# Patient Record
Sex: Male | Born: 1990 | Race: White | Hispanic: No | Marital: Single
Health system: Southern US, Community
[De-identification: ages and names within clinical notes are randomized; demographics above are authoritative.]

---

## 2010-06-07 ENCOUNTER — Emergency Department (HOSPITAL_COMMUNITY): Payer: BLUE CROSS/BLUE SHIELD

## 2010-06-07 ENCOUNTER — Emergency Department (HOSPITAL_COMMUNITY)
Admission: EM | Admit: 2010-06-07 | Discharge: 2010-06-07 | Disposition: A | Discharge status: Home / Self Care | Payer: BLUE CROSS/BLUE SHIELD | Attending: Emergency Medicine | Admitting: Emergency Medicine

## 2010-06-07 DIAGNOSIS — R110 Nausea: Secondary | ICD-10-CM | POA: Insufficient documentation

## 2010-06-07 DIAGNOSIS — R55 Syncope and collapse: Secondary | ICD-10-CM | POA: Insufficient documentation

## 2010-06-07 LAB — DIFFERENTIAL
Basophils Absolute: 0.1 10*3/uL (ref 0.0–0.1)
Basophils Relative: 1 % (ref 0–1)
Monocytes Absolute: 0.7 10*3/uL (ref 0.1–1.0)
Neutro Abs: 5.9 10*3/uL (ref 1.7–7.7)
Neutrophils Relative %: 67 % (ref 43–77)

## 2010-06-07 LAB — CBC
Hemoglobin: 14.9 g/dL (ref 13.0–17.0)
MCHC: 35.9 g/dL (ref 30.0–36.0)
WBC: 8.7 10*3/uL (ref 4.0–10.5)

## 2010-06-07 LAB — RAPID URINE DRUG SCREEN, HOSP PERFORMED
Barbiturates: NOT DETECTED
Opiates: NOT DETECTED

## 2010-06-07 LAB — POCT I-STAT, CHEM 8
BUN: 13 mg/dL (ref 6–23)
Hemoglobin: 15.3 g/dL (ref 13.0–17.0)
Sodium: 139 mEq/L (ref 135–145)
TCO2: 26 mmol/L (ref 0–100)

## 2010-06-07 LAB — URINALYSIS, ROUTINE W REFLEX MICROSCOPIC
Glucose, UA: NEGATIVE mg/dL
Protein, ur: NEGATIVE mg/dL
Urobilinogen, UA: 0.2 mg/dL (ref 0.0–1.0)

## 2013-03-14 IMAGING — CT CT HEAD W/O CM
2 series · 16 of 30 positions shown, 20 images · non-contrast
Comparison: None.

CLINICAL DATA: Syncope

CT HEAD WITHOUT CONTRAST
TECHNIQUE: Contiguous axial images were obtained from the base of
the skull through the vertex without contrast

[Series 2: head w/o · axial · non-contrast · 0.49mm/px · z∈[+122,+252]mm · 13 of 32 slices shown, 17 images]
[im 3/32  brain]
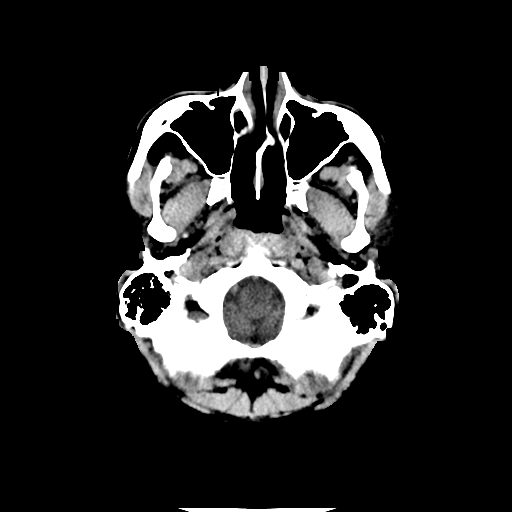
[im 3/32  bone]
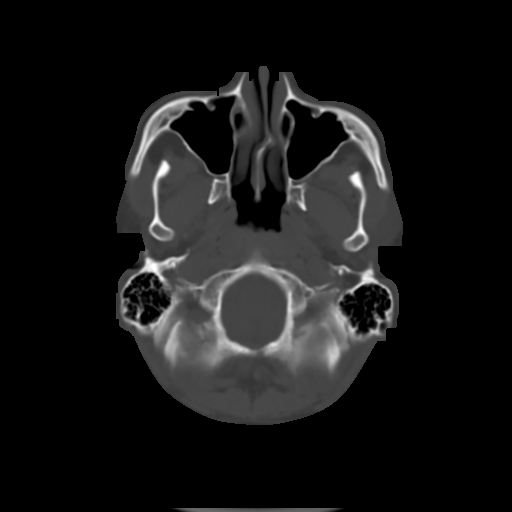
[im 5/32  brain]
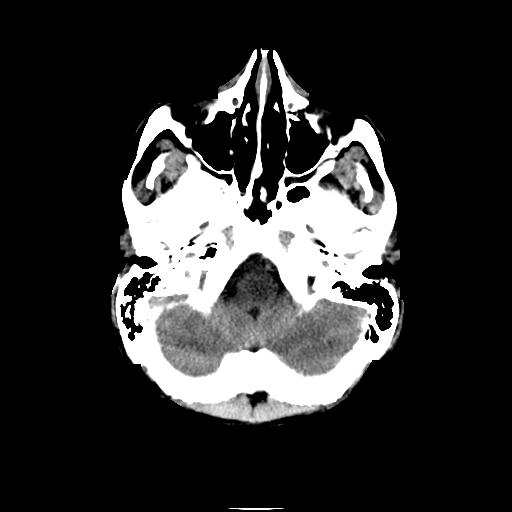
[im 7/32  brain]
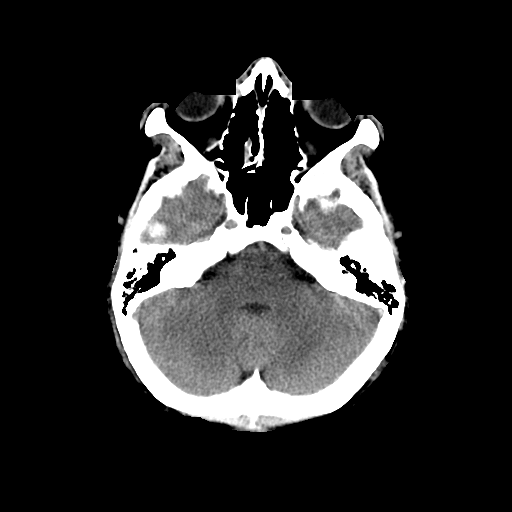
[im 9/32  brain]
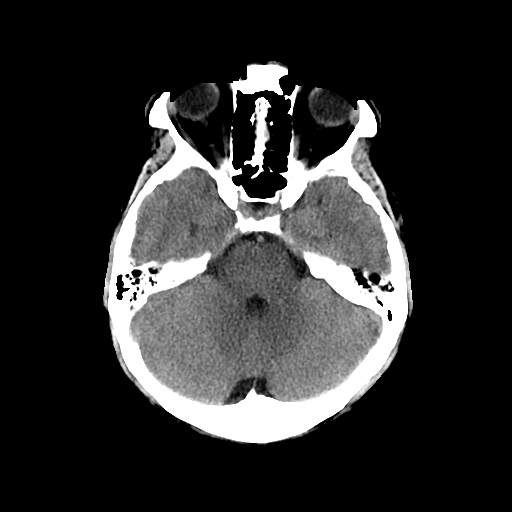
[im 12/32  brain]
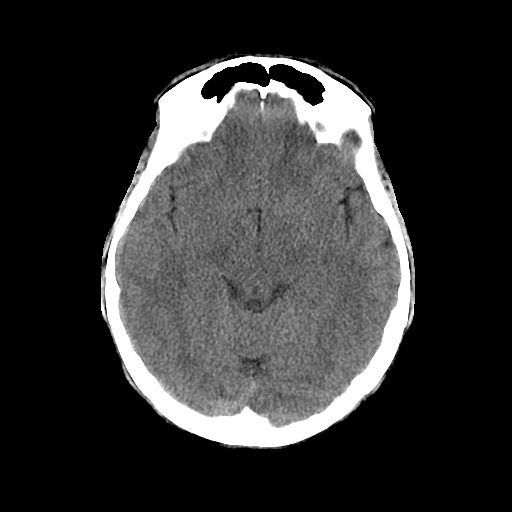
[im 12/32  bone]
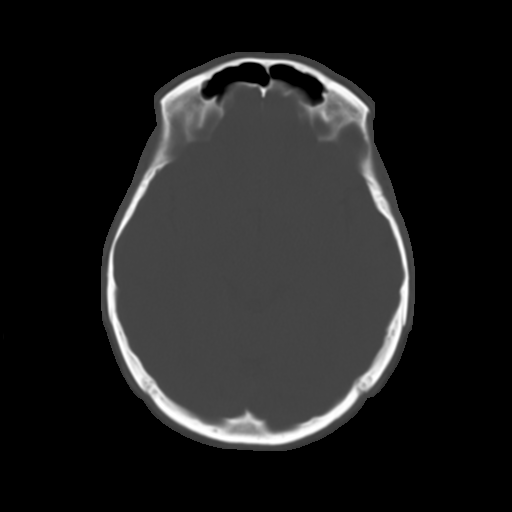
[im 14/32  brain]
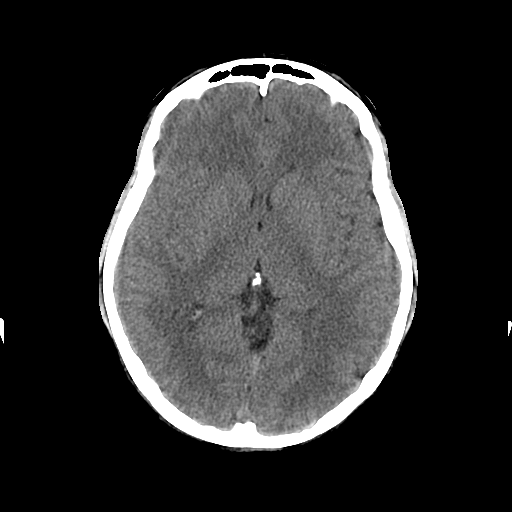
[im 16/32  brain]
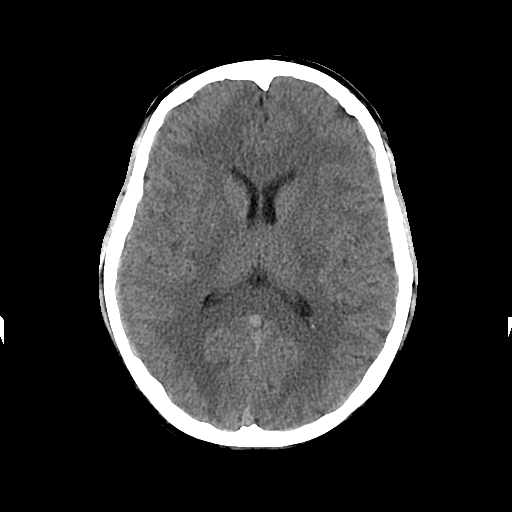
[im 18/32  brain]
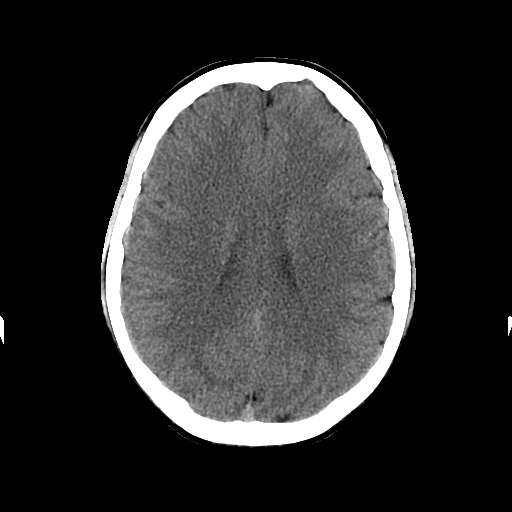
[im 20/32  brain]
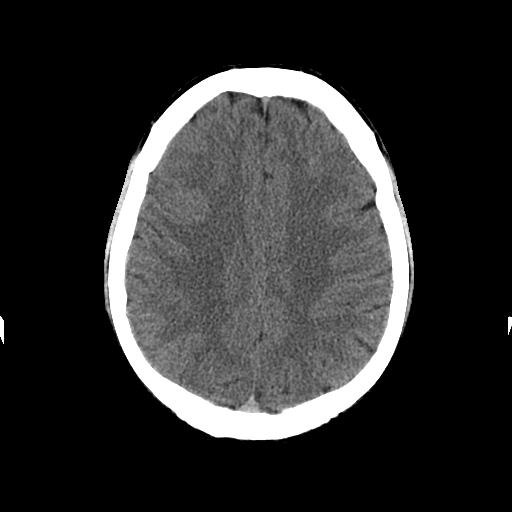
[im 20/32  bone]
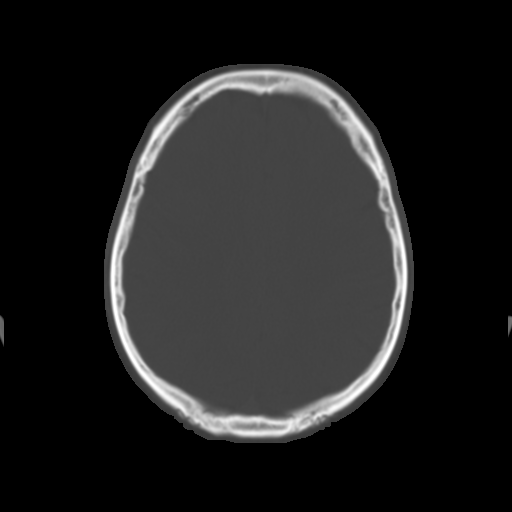
[im 23/32  brain]
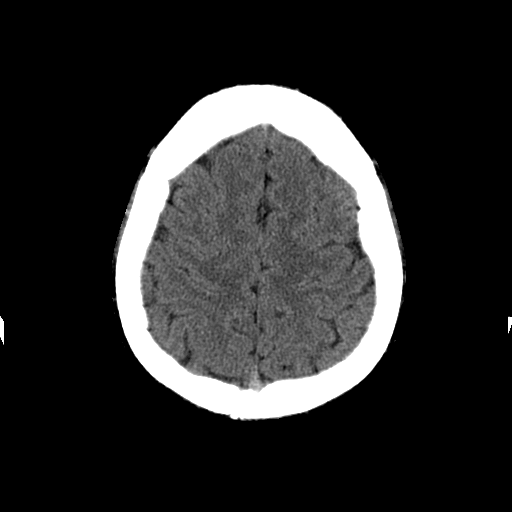
[im 25/32  brain]
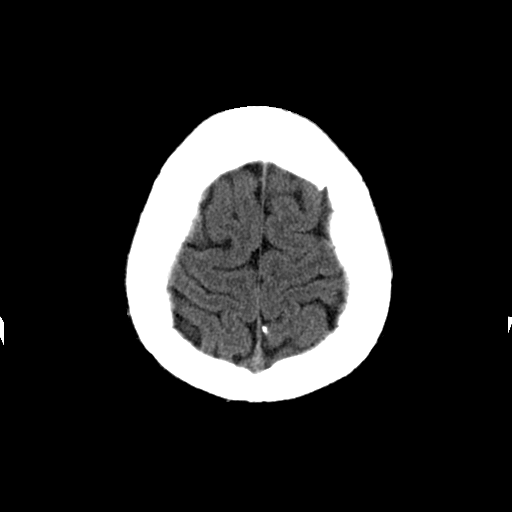
[im 27/32  brain]
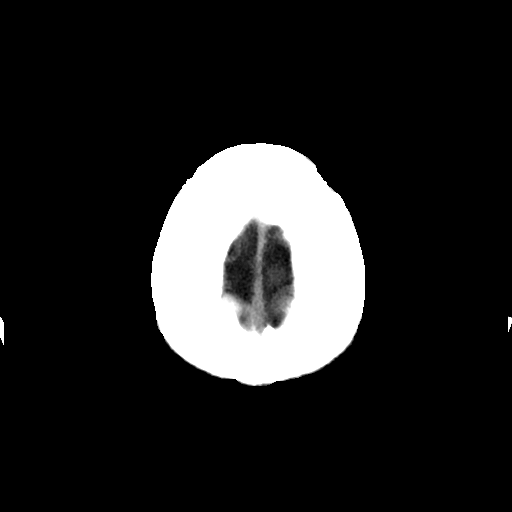
[im 29/32  brain]
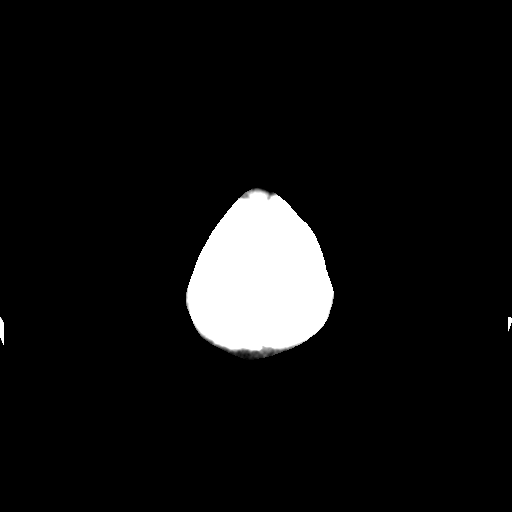
[im 29/32  bone]
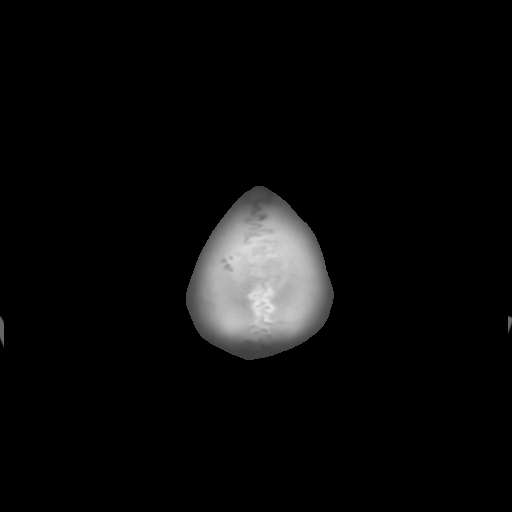

[Series 3: head w/o bone · axial · non-contrast · 0.49mm/px · z∈[+122,+167]mm · 3 of 32 slices shown]
[im 3/32  bone]
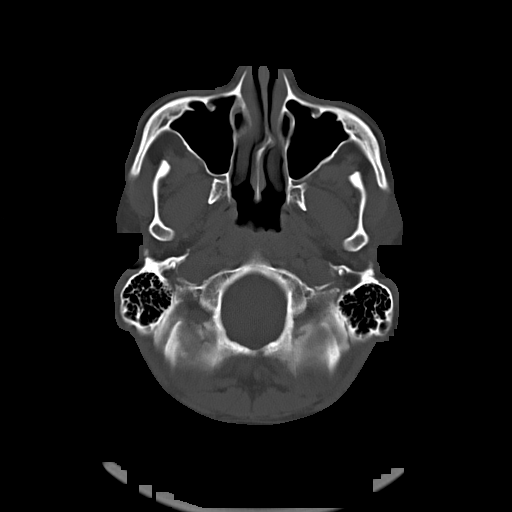
[im 7/32  bone]
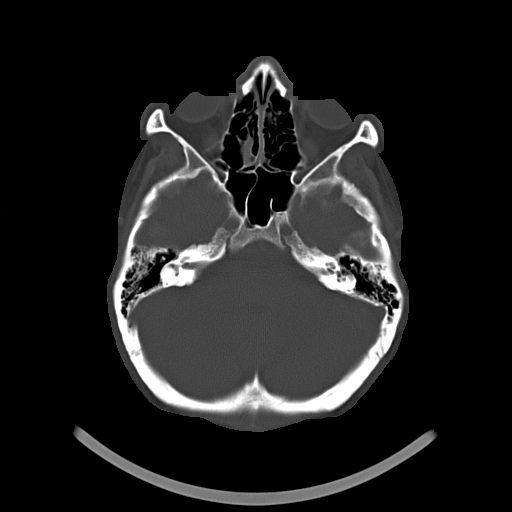
[im 12/32  bone]
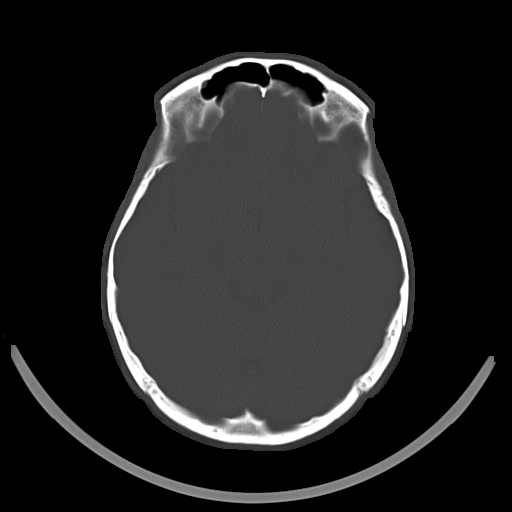

[16 of 30 positions shown; findings below may reference images not displayed]

FINDINGS: The brain has a normal appearance without evidence for
hemorrhage, acute infarction, hydrocephalus, or mass lesion.  There
is no extra axial fluid collection.  The skull and paranasal
sinuses are normal.
IMPRESSION: Normal CT of the head without contrast.

## 2019-10-03 DIAGNOSIS — F320 Major depressive disorder, single episode, mild: Secondary | ICD-10-CM | POA: Diagnosis not present

## 2019-10-31 DIAGNOSIS — F320 Major depressive disorder, single episode, mild: Secondary | ICD-10-CM | POA: Diagnosis not present

## 2019-11-21 DIAGNOSIS — F320 Major depressive disorder, single episode, mild: Secondary | ICD-10-CM | POA: Diagnosis not present

## 2019-12-07 DIAGNOSIS — F320 Major depressive disorder, single episode, mild: Secondary | ICD-10-CM | POA: Diagnosis not present

## 2019-12-14 DIAGNOSIS — Z20822 Contact with and (suspected) exposure to covid-19: Secondary | ICD-10-CM | POA: Diagnosis not present

## 2019-12-26 DIAGNOSIS — F320 Major depressive disorder, single episode, mild: Secondary | ICD-10-CM | POA: Diagnosis not present

## 2020-01-09 DIAGNOSIS — F320 Major depressive disorder, single episode, mild: Secondary | ICD-10-CM | POA: Diagnosis not present

## 2020-01-15 DIAGNOSIS — Z20822 Contact with and (suspected) exposure to covid-19: Secondary | ICD-10-CM | POA: Diagnosis not present

## 2020-01-21 ENCOUNTER — Telehealth: Payer: Self-pay | Admitting: Physician Assistant

## 2020-01-21 DIAGNOSIS — Z20828 Contact with and (suspected) exposure to other viral communicable diseases: Secondary | ICD-10-CM | POA: Diagnosis not present

## 2020-01-21 NOTE — Telephone Encounter (Signed)
Called to discuss with patient about Covid symptoms and the use of sotrovimab, bamlanivimab/etesevimab or casirivimab/imdevimab, a monoclonal antibody infusion for those with mild to moderate Covid symptoms and at a high risk of hospitalization.  Pt is qualified for this infusion at the [facility] due to; Specific high risk criteria : BMI > 25  Invalid number and no mychart- unable to reach pt. Emergency contact has same number.    Cline Crock PA-C

## 2020-01-23 ENCOUNTER — Telehealth (HOSPITAL_COMMUNITY): Payer: Self-pay

## 2020-01-23 NOTE — Telephone Encounter (Signed)
Unable to connect with patient to discuss MAB therapy. Unable to leave VM and Mychart is not active.   Vee Bahe Loyola Mast, RN

## 2020-01-28 DIAGNOSIS — F320 Major depressive disorder, single episode, mild: Secondary | ICD-10-CM | POA: Diagnosis not present

## 2020-02-15 DIAGNOSIS — F320 Major depressive disorder, single episode, mild: Secondary | ICD-10-CM | POA: Diagnosis not present

## 2020-02-20 DIAGNOSIS — F320 Major depressive disorder, single episode, mild: Secondary | ICD-10-CM | POA: Diagnosis not present

## 2020-03-12 DIAGNOSIS — Z6841 Body Mass Index (BMI) 40.0 and over, adult: Secondary | ICD-10-CM | POA: Diagnosis not present

## 2020-03-12 DIAGNOSIS — Z131 Encounter for screening for diabetes mellitus: Secondary | ICD-10-CM | POA: Diagnosis not present

## 2020-03-12 DIAGNOSIS — Z2821 Immunization not carried out because of patient refusal: Secondary | ICD-10-CM | POA: Diagnosis not present

## 2020-03-12 DIAGNOSIS — Z833 Family history of diabetes mellitus: Secondary | ICD-10-CM | POA: Diagnosis not present

## 2020-03-12 DIAGNOSIS — Z0001 Encounter for general adult medical examination with abnormal findings: Secondary | ICD-10-CM | POA: Diagnosis not present

## 2020-03-12 DIAGNOSIS — Z0000 Encounter for general adult medical examination without abnormal findings: Secondary | ICD-10-CM | POA: Diagnosis not present

## 2020-03-12 DIAGNOSIS — Z130 Encounter for screening for diseases of the blood and blood-forming organs and certain disorders involving the immune mechanism: Secondary | ICD-10-CM | POA: Diagnosis not present

## 2020-03-12 DIAGNOSIS — Z13220 Encounter for screening for lipoid disorders: Secondary | ICD-10-CM | POA: Diagnosis not present

## 2020-03-12 DIAGNOSIS — R030 Elevated blood-pressure reading, without diagnosis of hypertension: Secondary | ICD-10-CM | POA: Diagnosis not present

## 2020-03-12 DIAGNOSIS — Z79899 Other long term (current) drug therapy: Secondary | ICD-10-CM | POA: Diagnosis not present

## 2020-03-12 DIAGNOSIS — Z13228 Encounter for screening for other metabolic disorders: Secondary | ICD-10-CM | POA: Diagnosis not present

## 2020-03-12 DIAGNOSIS — Z8042 Family history of malignant neoplasm of prostate: Secondary | ICD-10-CM | POA: Diagnosis not present

## 2020-08-27 DIAGNOSIS — Z6841 Body Mass Index (BMI) 40.0 and over, adult: Secondary | ICD-10-CM | POA: Diagnosis not present

## 2020-08-27 DIAGNOSIS — U071 COVID-19: Secondary | ICD-10-CM | POA: Diagnosis not present

## 2021-03-19 DIAGNOSIS — F411 Generalized anxiety disorder: Secondary | ICD-10-CM | POA: Diagnosis not present

## 2021-04-20 DIAGNOSIS — F411 Generalized anxiety disorder: Secondary | ICD-10-CM | POA: Diagnosis not present

## 2021-05-18 DIAGNOSIS — F411 Generalized anxiety disorder: Secondary | ICD-10-CM | POA: Diagnosis not present

## 2021-06-01 DIAGNOSIS — R319 Hematuria, unspecified: Secondary | ICD-10-CM | POA: Diagnosis not present

## 2021-06-15 DIAGNOSIS — F411 Generalized anxiety disorder: Secondary | ICD-10-CM | POA: Diagnosis not present

## 2021-07-09 DIAGNOSIS — F988 Other specified behavioral and emotional disorders with onset usually occurring in childhood and adolescence: Secondary | ICD-10-CM | POA: Diagnosis not present

## 2021-07-09 DIAGNOSIS — Z6841 Body Mass Index (BMI) 40.0 and over, adult: Secondary | ICD-10-CM | POA: Diagnosis not present

## 2021-07-13 DIAGNOSIS — F411 Generalized anxiety disorder: Secondary | ICD-10-CM | POA: Diagnosis not present

## 2021-08-10 DIAGNOSIS — F988 Other specified behavioral and emotional disorders with onset usually occurring in childhood and adolescence: Secondary | ICD-10-CM | POA: Diagnosis not present

## 2021-08-10 DIAGNOSIS — Z6841 Body Mass Index (BMI) 40.0 and over, adult: Secondary | ICD-10-CM | POA: Diagnosis not present

## 2021-08-17 DIAGNOSIS — F411 Generalized anxiety disorder: Secondary | ICD-10-CM | POA: Diagnosis not present

## 2021-09-15 DIAGNOSIS — F411 Generalized anxiety disorder: Secondary | ICD-10-CM | POA: Diagnosis not present

## 2021-10-12 DIAGNOSIS — F988 Other specified behavioral and emotional disorders with onset usually occurring in childhood and adolescence: Secondary | ICD-10-CM | POA: Diagnosis not present

## 2021-10-12 DIAGNOSIS — Z6841 Body Mass Index (BMI) 40.0 and over, adult: Secondary | ICD-10-CM | POA: Diagnosis not present

## 2021-10-19 DIAGNOSIS — F411 Generalized anxiety disorder: Secondary | ICD-10-CM | POA: Diagnosis not present

## 2021-11-16 DIAGNOSIS — F411 Generalized anxiety disorder: Secondary | ICD-10-CM | POA: Diagnosis not present

## 2021-12-07 DIAGNOSIS — R310 Gross hematuria: Secondary | ICD-10-CM | POA: Diagnosis not present

## 2021-12-07 DIAGNOSIS — Z79899 Other long term (current) drug therapy: Secondary | ICD-10-CM | POA: Diagnosis not present

## 2021-12-07 DIAGNOSIS — R361 Hematospermia: Secondary | ICD-10-CM | POA: Diagnosis not present

## 2021-12-07 DIAGNOSIS — R300 Dysuria: Secondary | ICD-10-CM | POA: Diagnosis not present

## 2021-12-08 DIAGNOSIS — R310 Gross hematuria: Secondary | ICD-10-CM | POA: Diagnosis not present

## 2021-12-08 DIAGNOSIS — R300 Dysuria: Secondary | ICD-10-CM | POA: Diagnosis not present

## 2021-12-14 DIAGNOSIS — F411 Generalized anxiety disorder: Secondary | ICD-10-CM | POA: Diagnosis not present

## 2021-12-15 ENCOUNTER — Other Ambulatory Visit: Payer: Self-pay | Admitting: Family Medicine

## 2021-12-15 DIAGNOSIS — R310 Gross hematuria: Secondary | ICD-10-CM

## 2022-01-15 ENCOUNTER — Encounter: Payer: Self-pay | Admitting: Family Medicine

## 2022-01-18 DIAGNOSIS — F411 Generalized anxiety disorder: Secondary | ICD-10-CM | POA: Diagnosis not present

## 2022-01-20 ENCOUNTER — Ambulatory Visit
Admission: RE | Admit: 2022-01-20 | Discharge: 2022-01-20 | Disposition: A | Discharge status: Home / Self Care | Payer: BC Managed Care – PPO | Source: Ambulatory Visit | Attending: Family Medicine | Admitting: Family Medicine

## 2022-01-20 ENCOUNTER — Other Ambulatory Visit: Payer: Self-pay | Admitting: Family Medicine

## 2022-01-20 DIAGNOSIS — K760 Fatty (change of) liver, not elsewhere classified: Secondary | ICD-10-CM | POA: Diagnosis not present

## 2022-01-20 DIAGNOSIS — R319 Hematuria, unspecified: Secondary | ICD-10-CM | POA: Diagnosis not present

## 2022-01-20 DIAGNOSIS — R310 Gross hematuria: Secondary | ICD-10-CM

## 2022-01-20 DIAGNOSIS — K6389 Other specified diseases of intestine: Secondary | ICD-10-CM | POA: Diagnosis not present

## 2022-01-20 MED ORDER — IOPAMIDOL (ISOVUE-300) INJECTION 61%
150.0000 mL | Freq: Once | INTRAVENOUS | Status: AC | PRN
  Administered 2022-01-20: 150 mL via INTRAVENOUS

## 2022-01-28 DIAGNOSIS — R310 Gross hematuria: Secondary | ICD-10-CM | POA: Diagnosis not present

## 2022-01-28 DIAGNOSIS — R300 Dysuria: Secondary | ICD-10-CM | POA: Diagnosis not present

## 2022-01-28 DIAGNOSIS — R361 Hematospermia: Secondary | ICD-10-CM | POA: Diagnosis not present

## 2022-02-11 DIAGNOSIS — R310 Gross hematuria: Secondary | ICD-10-CM | POA: Diagnosis not present

## 2022-02-11 DIAGNOSIS — R300 Dysuria: Secondary | ICD-10-CM | POA: Diagnosis not present

## 2022-02-11 DIAGNOSIS — R361 Hematospermia: Secondary | ICD-10-CM | POA: Diagnosis not present

## 2022-02-17 DIAGNOSIS — R361 Hematospermia: Secondary | ICD-10-CM | POA: Diagnosis not present

## 2022-02-17 DIAGNOSIS — R300 Dysuria: Secondary | ICD-10-CM | POA: Diagnosis not present

## 2022-02-17 DIAGNOSIS — R310 Gross hematuria: Secondary | ICD-10-CM | POA: Diagnosis not present

## 2022-02-17 DIAGNOSIS — N35919 Unspecified urethral stricture, male, unspecified site: Secondary | ICD-10-CM | POA: Diagnosis not present
